# Patient Record
Sex: Female | Born: 1980 | Race: Asian | Hispanic: No | Marital: Married | Smoking: Never smoker
Health system: Southern US, Community
[De-identification: ages and names within clinical notes are randomized; demographics above are authoritative.]

## PROBLEM LIST (undated history)

## (undated) DIAGNOSIS — D649 Anemia, unspecified: Secondary | ICD-10-CM

## (undated) HISTORY — PX: LASIK: SHX215

---

## 2005-05-30 ENCOUNTER — Other Ambulatory Visit: Admission: RE | Admit: 2005-05-30 | Discharge: 2005-05-30 | Payer: Self-pay | Admitting: Obstetrics and Gynecology

## 2008-03-07 ENCOUNTER — Emergency Department (HOSPITAL_BASED_OUTPATIENT_CLINIC_OR_DEPARTMENT_OTHER): Admission: EM | Admit: 2008-03-07 | Discharge: 2008-03-07 | Payer: Self-pay | Admitting: Emergency Medicine

## 2009-11-03 ENCOUNTER — Inpatient Hospital Stay (HOSPITAL_COMMUNITY): Admission: AD | Admit: 2009-11-03 | Discharge: 2009-11-05 | Payer: Self-pay | Admitting: Obstetrics & Gynecology

## 2009-11-04 ENCOUNTER — Encounter (INDEPENDENT_AMBULATORY_CARE_PROVIDER_SITE_OTHER): Payer: Self-pay | Admitting: Obstetrics & Gynecology

## 2010-10-05 LAB — CBC
HCT: 34.1 % — ABNORMAL LOW (ref 36.0–46.0)
Hemoglobin: 10.9 g/dL — ABNORMAL LOW (ref 12.0–15.0)
RBC: 4.6 MIL/uL (ref 3.87–5.11)
RBC: 5.32 MIL/uL — ABNORMAL HIGH (ref 3.87–5.11)
WBC: 10.3 10*3/uL (ref 4.0–10.5)
WBC: 12.4 10*3/uL — ABNORMAL HIGH (ref 4.0–10.5)

## 2010-10-05 LAB — RPR: RPR Ser Ql: NONREACTIVE

## 2014-07-14 LAB — CHG URINE PREGNANCY TEST: Preg Test, Ur: POSITIVE

## 2014-07-15 ENCOUNTER — Telehealth: Payer: Self-pay

## 2014-07-15 DIAGNOSIS — Z3201 Encounter for pregnancy test, result positive: Secondary | ICD-10-CM

## 2014-07-15 NOTE — Telephone Encounter (Signed)
Patient called front office staff to schedule new OB appointment. [facility]. U/S for viability and dating scheduled 07/23/13 at 1415. Patient informed of U/S appointment date, time and location. Asked for a later appointment-- gave her [phone] to call and schedule for time convenient for her. Informed patient that once results are reviewed for dating we will call her with New OB appointment. Advised that she call clinic if she does not hear by Friday 07/25/13. Patient verbalized understanding. No questions or concerns.

## 2014-07-18 NOTE — L&D Delivery Note (Signed)
Delivery Note At 8:26 PM a viable female was delivered via Vaginal, Spontaneous Delivery (Presentation: Left Occiput Anterior).  APGAR: 9, 9; weight  .   Placenta status: Intact, Spontaneous.  Cord: 3 vessels with the following complications: None.  Cord pH: not indicated  Anesthesia: Epidural  Episiotomy:  none Lacerations:  2nd degree Suture Repair: 2.0 3.0 vicryl rapide 4 deep figure of sutures used to repair 2nd degree laceration that extended to (but not through) external anal sphincter. 3-0 vicryl used in standard fashion. rectal exam done upon completion to confirm good approximation of muscle and no missed tears Est. Blood Loss (mL):  By MD estimate 350 cc  Mom to postpartum.  Baby to Couplet care / Skin to Skin.  Carolyn Beard A. 03/05/2015, 8:49 PM

## 2014-07-21 ENCOUNTER — Encounter: Payer: Self-pay | Admitting: *Deleted

## 2014-07-23 ENCOUNTER — Ambulatory Visit (HOSPITAL_COMMUNITY): Payer: Self-pay

## 2014-07-25 ENCOUNTER — Ambulatory Visit (HOSPITAL_COMMUNITY)
Admission: RE | Admit: 2014-07-25 | Discharge: 2014-07-25 | Disposition: A | Discharge status: Home / Self Care | Payer: BC Managed Care – PPO | Source: Ambulatory Visit | Attending: Family Medicine | Admitting: Family Medicine

## 2014-07-25 DIAGNOSIS — Z3A01 Less than 8 weeks gestation of pregnancy: Secondary | ICD-10-CM | POA: Diagnosis not present

## 2014-07-25 DIAGNOSIS — O26841 Uterine size-date discrepancy, first trimester: Secondary | ICD-10-CM | POA: Insufficient documentation

## 2014-07-25 DIAGNOSIS — Z3201 Encounter for pregnancy test, result positive: Secondary | ICD-10-CM

## 2014-07-28 NOTE — Telephone Encounter (Signed)
Patient returned call. Informed her of U/S results and need to start prenatal care. Informed her she will be contacted by our front office staff with NOB appointment-- advised she call if she does not hear tomorrow. Also advised she start taking a PNV.  Patient verbalized understanding and gratitude. No questions or concerns.

## 2014-07-28 NOTE — Telephone Encounter (Signed)
Patient had U/S showing live IUP 4779w5d on 07/25/14. Needs to initiate prenatal care-- patient wanted to establish care here. Message sent to admin pool to schedule patient for New OB in 2-3 weeks. Attempted to contact patient to inform her of results.  No answer. Left message stating we are calling with results, please call clinic.

## 2014-08-04 ENCOUNTER — Ambulatory Visit (INDEPENDENT_AMBULATORY_CARE_PROVIDER_SITE_OTHER): Payer: BC Managed Care – PPO | Admitting: Obstetrics and Gynecology

## 2014-08-04 ENCOUNTER — Encounter: Payer: Self-pay | Admitting: Obstetrics and Gynecology

## 2014-08-04 VITALS — BP 114/75 | HR 80 | Temp 97.9°F | Ht 65.0 in | Wt 133.1 lb

## 2014-08-04 DIAGNOSIS — Z113 Encounter for screening for infections with a predominantly sexual mode of transmission: Secondary | ICD-10-CM | POA: Diagnosis not present

## 2014-08-04 DIAGNOSIS — Z23 Encounter for immunization: Secondary | ICD-10-CM | POA: Diagnosis not present

## 2014-08-04 DIAGNOSIS — Z3481 Encounter for supervision of other normal pregnancy, first trimester: Secondary | ICD-10-CM

## 2014-08-04 DIAGNOSIS — Z118 Encounter for screening for other infectious and parasitic diseases: Secondary | ICD-10-CM | POA: Diagnosis not present

## 2014-08-04 DIAGNOSIS — Z3480 Encounter for supervision of other normal pregnancy, unspecified trimester: Secondary | ICD-10-CM | POA: Insufficient documentation

## 2014-08-04 LAB — POCT URINALYSIS DIP (DEVICE)
Glucose, UA: NEGATIVE mg/dL
KETONES UR: NEGATIVE mg/dL
NITRITE: NEGATIVE
PH: 5.5 (ref 5.0–8.0)
PROTEIN: NEGATIVE mg/dL
Specific Gravity, Urine: >=1.03 (ref 1.005–1.030)
Urobilinogen, UA: 0.2 mg/dL (ref 0.0–1.0)

## 2014-08-04 NOTE — Progress Notes (Signed)
   Subjective:    Carolyn Beard is a G2P1002 8353w1d being seen today for her first obstetrical visit.  Her obstetrical history is significant for no complications. Patient does intend to breast feed. Pregnancy history fully reviewed.  Patient reports nausea at bedtime  Filed Vitals:   08/04/14 0854 08/04/14 0900  BP: 114/75   Pulse: 80   Temp: 97.9 F (36.6 C)   Height:  5\' 5"  (1.651 m)  Weight: 133 lb 1.6 oz (60.374 kg)     HISTORY: OB History  Gravida Para Term Preterm AB SAB TAB Ectopic Multiple Living  2 1 1  0 0 0 0 0 0 2    # Outcome Date GA Lbr Len/2nd Weight Sex Delivery Anes PTL Lv  2 Current           1 Term 11/04/09 7337w0d  5 lb 11 oz (2.58 kg) F Vag-Spont   Y     History reviewed. No pertinent past medical history. History reviewed. No pertinent past surgical history. History reviewed. No pertinent family history.   Exam    Uterus:     Pelvic Exam:    Perineum: Normal Perineum   Vulva: normal   Vagina:  normal mucosa, normal discharge   pH:    Cervix: closed and long   Adnexa: normal adnexa   Bony Pelvis: gynecoid  System: Breast:  normal appearance, no masses or tenderness   Skin: normal coloration and turgor, no rashes    Neurologic: oriented, no focal deficits   Extremities: normal strength, tone, and muscle mass   HEENT extra ocular movement intact   Mouth/Teeth mucous membranes moist, pharynx normal without lesions and dental hygiene good   Neck supple and no masses   Cardiovascular: regular rate and rhythm   Respiratory:  chest clear, no wheezing, crepitations, rhonchi, normal symmetric air entry   Abdomen: soft, non-tender; bowel sounds normal; no masses,  no organomegaly   Urinary:       Assessment:    Pregnancy: [id number] Patient Active Problem List   Diagnosis Date Noted  . Supervision of other normal pregnancy, antepartum 08/04/2014        Plan:     Initial labs drawn. Will obtain records for pap smear Prenatal  vitamins. Problem list reviewed and updated. Genetic Screening discussed First Screen: declined.  Ultrasound discussed; fetal survey: requested.  Follow up in 4 weeks. 50% of 30 min visit spent on counseling and coordination of care.     Mikell Camp 08/04/2014

## 2014-08-04 NOTE — Progress Notes (Signed)
Patient reports was told pre-diabetic a couple years ago-- has managed with exercise though reports PCP has not mentioned it since.  Reports she was told she was vitamin D deficient-- was taking Vitamin D regularly and has stopped since starting PNV.  Initial lab work today. Reports [facility]-- reports normal.  Flu shot today.  New OB packet given.

## 2014-08-04 NOTE — Patient Instructions (Signed)
First Trimester of Pregnancy The first trimester of pregnancy is from week 1 until the end of week 12 (months 1 through 3). A week after a sperm fertilizes an egg, the egg will implant on the wall of the uterus. This embryo will begin to develop into a baby. Genes from you and your partner are forming the baby. The female genes determine whether the baby is a boy or a girl. At 6-8 weeks, the eyes and face are formed, and the heartbeat can be seen on ultrasound. At the end of 12 weeks, all the baby's organs are formed.  Now that you are pregnant, you will want to do everything you can to have a healthy baby. Two of the most important things are to get good prenatal care and to follow your health care provider's instructions. Prenatal care is all the medical care you receive before the baby's birth. This care will help prevent, find, and treat any problems during the pregnancy and childbirth. BODY CHANGES Your body goes through many changes during pregnancy. The changes vary from woman to woman.   You may gain or lose a couple of pounds at first.  You may feel sick to your stomach (nauseous) and throw up (vomit). If the vomiting is uncontrollable, call your health care provider.  You may tire easily.  You may develop headaches that can be relieved by medicines approved by your health care provider.  You may urinate more often. Painful urination may mean you have a bladder infection.  You may develop heartburn as a result of your pregnancy.  You may develop constipation because certain hormones are causing the muscles that push waste through your intestines to slow down.  You may develop hemorrhoids or swollen, bulging veins (varicose veins).  Your breasts may begin to grow larger and become tender. Your nipples may stick out more, and the tissue that surrounds them (areola) may become darker.  Your gums may bleed and may be sensitive to brushing and flossing.  Dark spots or blotches  (chloasma, mask of pregnancy) may develop on your face. This will likely fade after the baby is born.  Your menstrual periods will stop.  You may have a loss of appetite.  You may develop cravings for certain kinds of food.  You may have changes in your emotions from day to day, such as being excited to be pregnant or being concerned that something may go wrong with the pregnancy and baby.  You may have more vivid and strange dreams.  You may have changes in your hair. These can include thickening of your hair, rapid growth, and changes in texture. Some women also have hair loss during or after pregnancy, or hair that feels dry or thin. Your hair will most likely return to normal after your baby is born. WHAT TO EXPECT AT YOUR PRENATAL VISITS During a routine prenatal visit:  You will be weighed to make sure you and the baby are growing normally.  Your blood pressure will be taken.  Your abdomen will be measured to track your baby's growth.  The fetal heartbeat will be listened to starting around week 10 or 12 of your pregnancy.  Test results from any previous visits will be discussed. Your health care provider may ask you:  How you are feeling.  If you are feeling the baby move.  If you have had any abnormal symptoms, such as leaking fluid, bleeding, severe headaches, or abdominal cramping.  If you have any questions. Other tests   that may be performed during your first trimester include:  Blood tests to find your blood type and to check for the presence of any previous infections. They will also be used to check for low iron levels (anemia) and Rh antibodies. Later in the pregnancy, blood tests for diabetes will be done along with other tests if problems develop.  Urine tests to check for infections, diabetes, or protein in the urine.  An ultrasound to confirm the proper growth and development of the baby.  An amniocentesis to check for possible genetic problems.  Fetal  screens for spina bifida and Down syndrome.  You may need other tests to make sure you and the baby are doing well. HOME CARE INSTRUCTIONS  Medicines  Follow your health care provider's instructions regarding medicine use. Specific medicines may be either safe or unsafe to take during pregnancy.  Take your prenatal vitamins as directed.  If you develop constipation, try taking a stool softener if your health care provider approves. Diet  Eat regular, well-balanced meals. Choose a variety of foods, such as meat or vegetable-based protein, fish, milk and low-fat dairy products, vegetables, fruits, and whole grain breads and cereals. Your health care provider will help you determine the amount of weight gain that is right for you.  Avoid raw meat and uncooked cheese. These carry germs that can cause birth defects in the baby.  Eating four or five small meals rather than three large meals a day may help relieve nausea and vomiting. If you start to feel nauseous, eating a few soda crackers can be helpful. Drinking liquids between meals instead of during meals also seems to help nausea and vomiting.  If you develop constipation, eat more high-fiber foods, such as fresh vegetables or fruit and whole grains. Drink enough fluids to keep your urine clear or pale yellow. Activity and Exercise  Exercise only as directed by your health care provider. Exercising will help you:  Control your weight.  Stay in shape.  Be prepared for labor and delivery.  Experiencing pain or cramping in the lower abdomen or low back is a good sign that you should stop exercising. Check with your health care provider before continuing normal exercises.  Try to avoid standing for long periods of time. Move your legs often if you must stand in one place for a long time.  Avoid heavy lifting.  Wear low-heeled shoes, and practice good posture.  You may continue to have sex unless your health care provider directs you  otherwise. Relief of Pain or Discomfort  Wear a good support bra for breast tenderness.   Take warm sitz baths to soothe any pain or discomfort caused by hemorrhoids. Use hemorrhoid cream if your health care provider approves.   Rest with your legs elevated if you have leg cramps or low back pain.  If you develop varicose veins in your legs, wear support hose. Elevate your feet for 15 minutes, 3-4 times a day. Limit salt in your diet. Prenatal Care  Schedule your prenatal visits by the twelfth week of pregnancy. They are usually scheduled monthly at first, then more often in the last 2 months before delivery.  Write down your questions. Take them to your prenatal visits.  Keep all your prenatal visits as directed by your health care provider. Safety  Wear your seat belt at all times when driving.  Make a list of emergency phone numbers, including numbers for family, friends, the hospital, and police and fire departments. General Tips    Ask your health care provider for a referral to a local prenatal education class. Begin classes no later than at the beginning of month 6 of your pregnancy.  Ask for help if you have counseling or nutritional needs during pregnancy. Your health care provider can offer advice or refer you to specialists for help with various needs.  Do not use hot tubs, steam rooms, or saunas.  Do not douche or use tampons or scented sanitary pads.  Do not cross your legs for long periods of time.  Avoid cat litter boxes and soil used by cats. These carry germs that can cause birth defects in the baby and possibly loss of the fetus by miscarriage or stillbirth.  Avoid all smoking, herbs, alcohol, and medicines not prescribed by your health care provider. Chemicals in these affect the formation and growth of the baby.  Schedule a dentist appointment. At home, brush your teeth with a soft toothbrush and be gentle when you floss. SEEK MEDICAL CARE IF:   You have  dizziness.  You have mild pelvic cramps, pelvic pressure, or nagging pain in the abdominal area.  You have persistent nausea, vomiting, or diarrhea.  You have a bad smelling vaginal discharge.  You have pain with urination.  You notice increased swelling in your face, hands, legs, or ankles. SEEK IMMEDIATE MEDICAL CARE IF:   You have a fever.  You are leaking fluid from your vagina.  You have spotting or bleeding from your vagina.  You have severe abdominal cramping or pain.  You have rapid weight gain or loss.  You vomit blood or material that looks like coffee grounds.  You are exposed to German measles and have never had them.  You are exposed to fifth disease or chickenpox.  You develop a severe headache.  You have shortness of breath.  You have any kind of trauma, such as from a fall or a car accident. Document Released: 06/28/2001 Document Revised: 11/18/2013 Document Reviewed: 05/14/2013 [employer] Patient Information 2015 [employer]. This information is not intended to replace advice given to you by your health care provider. Make sure you discuss any questions you have with your health care provider.  Contraception Choices Contraception (birth control) is the use of any methods or devices to prevent pregnancy. Below are some methods to help avoid pregnancy. HORMONAL METHODS   Contraceptive implant. This is a thin, plastic tube containing progesterone hormone. It does not contain estrogen hormone. Your health care provider inserts the tube in the inner part of the upper arm. The tube can remain in place for up to 3 years. After 3 years, the implant must be removed. The implant prevents the ovaries from releasing an egg (ovulation), thickens the cervical mucus to prevent sperm from entering the uterus, and thins the lining of the inside of the uterus.  Progesterone-only injections. These injections are given every 3 months by your health care provider to prevent  pregnancy. This synthetic progesterone hormone stops the ovaries from releasing eggs. It also thickens cervical mucus and changes the uterine lining. This makes it harder for sperm to survive in the uterus.  Birth control pills. These pills contain estrogen and progesterone hormone. They work by preventing the ovaries from releasing eggs (ovulation). They also cause the cervical mucus to thicken, preventing the sperm from entering the uterus. Birth control pills are prescribed by a health care provider.Birth control pills can also be used to treat heavy periods.  Minipill. This type of birth control pill contains   only the progesterone hormone. They are taken every day of each month and must be prescribed by your health care provider.  Birth control patch. The patch contains hormones similar to those in birth control pills. It must be changed once a week and is prescribed by a health care provider.  Vaginal ring. The ring contains hormones similar to those in birth control pills. It is left in the vagina for 3 weeks, removed for 1 week, and then a new one is put back in place. The patient must be comfortable inserting and removing the ring from the vagina.A health care provider's prescription is necessary.  Emergency contraception. Emergency contraceptives prevent pregnancy after unprotected sexual intercourse. This pill can be taken right after sex or up to 5 days after unprotected sex. It is most effective the sooner you take the pills after having sexual intercourse. Most emergency contraceptive pills are available without a prescription. Check with your pharmacist. Do not use emergency contraception as your only form of birth control. BARRIER METHODS   Female condom. This is a thin sheath (latex or rubber) that is worn over the penis during sexual intercourse. It can be used with spermicide to increase effectiveness.  Female condom. This is a soft, loose-fitting sheath that is put into the vagina  before sexual intercourse.  Diaphragm. This is a soft, latex, dome-shaped barrier that must be fitted by a health care provider. It is inserted into the vagina, along with a spermicidal jelly. It is inserted before intercourse. The diaphragm should be left in the vagina for 6 to 8 hours after intercourse.  Cervical cap. This is a round, soft, latex or plastic cup that fits over the cervix and must be fitted by a health care provider. The cap can be left in place for up to 48 hours after intercourse.  Sponge. This is a soft, circular piece of polyurethane foam. The sponge has spermicide in it. It is inserted into the vagina after wetting it and before sexual intercourse.  Spermicides. These are chemicals that kill or block sperm from entering the cervix and uterus. They come in the form of creams, jellies, suppositories, foam, or tablets. They do not require a prescription. They are inserted into the vagina with an applicator before having sexual intercourse. The process must be repeated every time you have sexual intercourse. INTRAUTERINE CONTRACEPTION  Intrauterine device (IUD). This is a T-shaped device that is put in a woman's uterus during a menstrual period to prevent pregnancy. There are 2 types:  Copper IUD. This type of IUD is wrapped in copper wire and is placed inside the uterus. Copper makes the uterus and fallopian tubes produce a fluid that kills sperm. It can stay in place for 10 years.  Hormone IUD. This type of IUD contains the hormone progestin (synthetic progesterone). The hormone thickens the cervical mucus and prevents sperm from entering the uterus, and it also thins the uterine lining to prevent implantation of a fertilized egg. The hormone can weaken or kill the sperm that get into the uterus. It can stay in place for 3-5 years, depending on which type of IUD is used. PERMANENT METHODS OF CONTRACEPTION  Female tubal ligation. This is when the woman's fallopian tubes are  surgically sealed, tied, or blocked to prevent the egg from traveling to the uterus.  Hysteroscopic sterilization. This involves placing a small coil or insert into each fallopian tube. Your doctor uses a technique called hysteroscopy to do the procedure. The device causes scar tissue   to form. This results in permanent blockage of the fallopian tubes, so the sperm cannot fertilize the egg. It takes about 3 months after the procedure for the tubes to become blocked. You must use another form of birth control for these 3 months.  Female sterilization. This is when the female has the tubes that carry sperm tied off (vasectomy).This blocks sperm from entering the vagina during sexual intercourse. After the procedure, the man can still ejaculate fluid (semen). NATURAL PLANNING METHODS  Natural family planning. This is not having sexual intercourse or using a barrier method (condom, diaphragm, cervical cap) on days the woman could become pregnant.  Calendar method. This is keeping track of the length of each menstrual cycle and identifying when you are fertile.  Ovulation method. This is avoiding sexual intercourse during ovulation.  Symptothermal method. This is avoiding sexual intercourse during ovulation, using a thermometer and ovulation symptoms.  Post-ovulation method. This is timing sexual intercourse after you have ovulated. Regardless of which type or method of contraception you choose, it is important that you use condoms to protect against the transmission of sexually transmitted infections (STIs). Talk with your health care provider about which form of contraception is most appropriate for you. Document Released: 07/04/2005 Document Revised: 07/09/2013 Document Reviewed: 12/27/2012 [employer] Patient Information 2015 [employer]. This information is not intended to replace advice given to you by your health care provider. Make sure you discuss any questions you have with your health care  provider.  Breastfeeding Deciding to breastfeed is one of the best choices you can make for you and your baby. A change in hormones during pregnancy causes your breast tissue to grow and increases the number and size of your milk ducts. These hormones also allow proteins, sugars, and fats from your blood supply to make breast milk in your milk-producing glands. Hormones prevent breast milk from being released before your baby is born as well as prompt milk flow after birth. Once breastfeeding has begun, thoughts of your baby, as well as his or her sucking or crying, can stimulate the release of milk from your milk-producing glands.  BENEFITS OF BREASTFEEDING For Your Baby  Your first milk (colostrum) helps your baby's digestive system function better.   There are antibodies in your milk that help your baby fight off infections.   Your baby has a lower incidence of asthma, allergies, and sudden infant death syndrome.   The nutrients in breast milk are better for your baby than infant formulas and are designed uniquely for your baby's needs.   Breast milk improves your baby's brain development.   Your baby is less likely to develop other conditions, such as childhood obesity, asthma, or type 2 diabetes mellitus.  For You   Breastfeeding helps to create a very special bond between you and your baby.   Breastfeeding is convenient. Breast milk is always available at the correct temperature and costs nothing.   Breastfeeding helps to burn calories and helps you lose the weight gained during pregnancy.   Breastfeeding makes your uterus contract to its prepregnancy size faster and slows bleeding (lochia) after you give birth.   Breastfeeding helps to lower your risk of developing type 2 diabetes mellitus, osteoporosis, and breast or ovarian cancer later in life. SIGNS THAT YOUR BABY IS HUNGRY Early Signs of Hunger  Increased alertness or activity.  Stretching.  Movement of the  head from side to side.  Movement of the head and opening of the mouth when the corner   of the mouth or cheek is stroked (rooting).  Increased sucking sounds, smacking lips, cooing, sighing, or squeaking.  Hand-to-mouth movements.  Increased sucking of fingers or hands. Late Signs of Hunger  Fussing.  Intermittent crying. Extreme Signs of Hunger Signs of extreme hunger will require calming and consoling before your baby will be able to breastfeed successfully. Do not wait for the following signs of extreme hunger to occur before you initiate breastfeeding:   Restlessness.  A loud, strong cry.   Screaming. BREASTFEEDING BASICS Breastfeeding Initiation  Find a comfortable place to sit or lie down, with your neck and back well supported.  Place a pillow or rolled up blanket under your baby to bring him or her to the level of your breast (if you are seated). Nursing pillows are specially designed to help support your arms and your baby while you breastfeed.  Make sure that your baby's abdomen is facing your abdomen.   Gently massage your breast. With your fingertips, massage from your chest wall toward your nipple in a circular motion. This encourages milk flow. You may need to continue this action during the feeding if your milk flows slowly.  Support your breast with 4 fingers underneath and your thumb above your nipple. Make sure your fingers are well away from your nipple and your baby's mouth.   Stroke your baby's lips gently with your finger or nipple.   When your baby's mouth is open wide enough, quickly bring your baby to your breast, placing your entire nipple and as much of the colored area around your nipple (areola) as possible into your baby's mouth.   More areola should be visible above your baby's upper lip than below the lower lip.   Your baby's tongue should be between his or her lower gum and your breast.   Ensure that your baby's mouth is correctly  positioned around your nipple (latched). Your baby's lips should create a seal on your breast and be turned out (everted).  It is common for your baby to suck about 2-3 minutes in order to start the flow of breast milk. Latching Teaching your baby how to latch on to your breast properly is very important. An improper latch can cause nipple pain and decreased milk supply for you and poor weight gain in your baby. Also, if your baby is not latched onto your nipple properly, he or she may swallow some air during feeding. This can make your baby fussy. Burping your baby when you switch breasts during the feeding can help to get rid of the air. However, teaching your baby to latch on properly is still the best way to prevent fussiness from swallowing air while breastfeeding. Signs that your baby has successfully latched on to your nipple:    Silent tugging or silent sucking, without causing you pain.   Swallowing heard between every 3-4 sucks.    Muscle movement above and in front of his or her ears while sucking.  Signs that your baby has not successfully latched on to nipple:   Sucking sounds or smacking sounds from your baby while breastfeeding.  Nipple pain. If you think your baby has not latched on correctly, slip your finger into the corner of your baby's mouth to break the suction and place it between your baby's gums. Attempt breastfeeding initiation again. Signs of Successful Breastfeeding Signs from your baby:   A gradual decrease in the number of sucks or complete cessation of sucking.   Falling asleep.     Relaxation of his or her body.   Retention of a small amount of milk in his or her mouth.   Letting go of your breast by himself or herself. Signs from you:  Breasts that have increased in firmness, weight, and size 1-3 hours after feeding.   Breasts that are softer immediately after breastfeeding.  Increased milk volume, as well as a change in milk consistency  and color by the fifth day of breastfeeding.   Nipples that are not sore, cracked, or bleeding. Signs That Your Baby is Getting Enough Milk  Wetting at least 3 diapers in a 24-hour period. The urine should be clear and pale yellow by age 5 days.  At least 3 stools in a 24-hour period by age 5 days. The stool should be soft and yellow.  At least 3 stools in a 24-hour period by age 7 days. The stool should be seedy and yellow.  No loss of weight greater than 10% of birth weight during the first 3 days of age.  Average weight gain of 4-7 ounces (113-198 g) per week after age 4 days.  Consistent daily weight gain by age 5 days, without weight loss after the age of 2 weeks. After a feeding, your baby may spit up a small amount. This is common. BREASTFEEDING FREQUENCY AND DURATION Frequent feeding will help you make more milk and can prevent sore nipples and breast engorgement. Breastfeed when you feel the need to reduce the fullness of your breasts or when your baby shows signs of hunger. This is called "breastfeeding on demand." Avoid introducing a pacifier to your baby while you are working to establish breastfeeding (the first 4-6 weeks after your baby is born). After this time you may choose to use a pacifier. Research has shown that pacifier use during the first year of a baby's life decreases the risk of sudden infant death syndrome (SIDS). Allow your baby to feed on each breast as long as he or she wants. Breastfeed until your baby is finished feeding. When your baby unlatches or falls asleep while feeding from the first breast, offer the second breast. Because newborns are often sleepy in the first few weeks of life, you may need to awaken your baby to get him or her to feed. Breastfeeding times will vary from baby to baby. However, the following rules can serve as a guide to help you ensure that your baby is properly fed:  Newborns (babies 4 weeks of age or younger) may breastfeed every  1-3 hours.  Newborns should not go longer than 3 hours during the day or 5 hours during the night without breastfeeding.  You should breastfeed your baby a minimum of 8 times in a 24-hour period until you begin to introduce solid foods to your baby at around 6 months of age. BREAST MILK PUMPING Pumping and storing breast milk allows you to ensure that your baby is exclusively fed your breast milk, even at times when you are unable to breastfeed. This is especially important if you are going back to work while you are still breastfeeding or when you are not able to be present during feedings. Your lactation consultant can give you guidelines on how long it is safe to store breast milk.  A breast pump is a machine that allows you to pump milk from your breast into a sterile bottle. The pumped breast milk can then be stored in a refrigerator or freezer. Some breast pumps are operated by hand, while others   use electricity. Ask your lactation consultant which type will work best for you. Breast pumps can be purchased, but some hospitals and breastfeeding support groups lease breast pumps on a monthly basis. A lactation consultant can teach you how to hand express breast milk, if you prefer not to use a pump.  CARING FOR YOUR BREASTS WHILE YOU BREASTFEED Nipples can become dry, cracked, and sore while breastfeeding. The following recommendations can help keep your breasts moisturized and healthy:  Avoid using soap on your nipples.   Wear a supportive bra. Although not required, special nursing bras and tank tops are designed to allow access to your breasts for breastfeeding without taking off your entire bra or top. Avoid wearing underwire-style bras or extremely tight bras.  Air dry your nipples for 3-4minutes after each feeding.   Use only cotton bra pads to absorb leaked breast milk. Leaking of breast milk between feedings is normal.   Use lanolin on your nipples after breastfeeding. Lanolin  helps to maintain your skin's normal moisture barrier. If you use pure lanolin, you do not need to wash it off before feeding your baby again. Pure lanolin is not toxic to your baby. You may also hand express a few drops of breast milk and gently massage that milk into your nipples and allow the milk to air dry. In the first few weeks after giving birth, some women experience extremely full breasts (engorgement). Engorgement can make your breasts feel heavy, warm, and tender to the touch. Engorgement peaks within 3-5 days after you give birth. The following recommendations can help ease engorgement:  Completely empty your breasts while breastfeeding or pumping. You may want to start by applying warm, moist heat (in the shower or with warm water-soaked hand towels) just before feeding or pumping. This increases circulation and helps the milk flow. If your baby does not completely empty your breasts while breastfeeding, pump any extra milk after he or she is finished.  Wear a snug bra (nursing or regular) or tank top for 1-2 days to signal your body to slightly decrease milk production.  Apply ice packs to your breasts, unless this is too uncomfortable for you.  Make sure that your baby is latched on and positioned properly while breastfeeding. If engorgement persists after 48 hours of following these recommendations, contact your health care provider or a lactation consultant. OVERALL HEALTH CARE RECOMMENDATIONS WHILE BREASTFEEDING  Eat healthy foods. Alternate between meals and snacks, eating 3 of each per day. Because what you eat affects your breast milk, some of the foods may make your baby more irritable than usual. Avoid eating these foods if you are sure that they are negatively affecting your baby.  Drink milk, fruit juice, and water to satisfy your thirst (about 10 glasses a day).   Rest often, relax, and continue to take your prenatal vitamins to prevent fatigue, stress, and  anemia.  Continue breast self-awareness checks.  Avoid chewing and smoking tobacco.  Avoid alcohol and drug use. Some medicines that may be harmful to your baby can pass through breast milk. It is important to ask your health care provider before taking any medicine, including all over-the-counter and prescription medicine as well as vitamin and herbal supplements. It is possible to become pregnant while breastfeeding. If birth control is desired, ask your health care provider about options that will be safe for your baby. SEEK MEDICAL CARE IF:   You feel like you want to stop breastfeeding or have become frustrated with breastfeeding.    You have painful breasts or nipples.  Your nipples are cracked or bleeding.  Your breasts are red, tender, or warm.  You have a swollen area on either breast.  You have a fever or chills.  You have nausea or vomiting.  You have drainage other than breast milk from your nipples.  Your breasts do not become full before feedings by the fifth day after you give birth.  You feel sad and depressed.  Your baby is too sleepy to eat well.  Your baby is having trouble sleeping.   Your baby is wetting less than 3 diapers in a 24-hour period.  Your baby has less than 3 stools in a 24-hour period.  Your baby's skin or the white part of his or her eyes becomes yellow.   Your baby is not gaining weight by 5 days of age. SEEK IMMEDIATE MEDICAL CARE IF:   Your baby is overly tired (lethargic) and does not want to wake up and feed.  Your baby develops an unexplained fever. Document Released: 07/04/2005 Document Revised: 07/09/2013 Document Reviewed: 12/26/2012 Exit[employer] Patient Information 2015 [employer]. This information is not intended to replace advice given to you by your health care provider. Make sure you discuss any questions you have with your health care provider.  

## 2014-08-04 NOTE — Progress Notes (Signed)
ROI signed for pap results

## 2014-08-05 LAB — GC/CHLAMYDIA PROBE AMP
CT PROBE, AMP APTIMA: NEGATIVE
GC Probe RNA: NEGATIVE

## 2014-08-05 LAB — PRENATAL PROFILE (SOLSTAS)
ANTIBODY SCREEN: NEGATIVE
BASOS PCT: 0 % (ref 0–1)
Basophils Absolute: 0 10*3/uL (ref 0.0–0.1)
EOS ABS: 0 10*3/uL (ref 0.0–0.7)
Eosinophils Relative: 0 % (ref 0–5)
HEMATOCRIT: 38.6 % (ref 36.0–46.0)
HEMOGLOBIN: 12.4 g/dL (ref 12.0–15.0)
HIV: NONREACTIVE
Hepatitis B Surface Ag: NEGATIVE
LYMPHS ABS: 2.1 10*3/uL (ref 0.7–4.0)
LYMPHS PCT: 19 % (ref 12–46)
MCH: 21.8 pg — ABNORMAL LOW (ref 26.0–34.0)
MCHC: 32.1 g/dL (ref 30.0–36.0)
MCV: 67.8 fL — AB (ref 78.0–100.0)
MONO ABS: 0.9 10*3/uL (ref 0.1–1.0)
Monocytes Relative: 8 % (ref 3–12)
NEUTROS ABS: 8.2 10*3/uL — AB (ref 1.7–7.7)
NEUTROS PCT: 73 % (ref 43–77)
PLATELETS: 291 10*3/uL (ref 150–400)
RBC: 5.69 MIL/uL — ABNORMAL HIGH (ref 3.87–5.11)
RDW: 16.6 % — ABNORMAL HIGH (ref 11.5–15.5)
RUBELLA: 0.67 {index} (ref <0.90)
Rh Type: POSITIVE
WBC: 11.3 10*3/uL — AB (ref 4.0–10.5)

## 2014-08-05 LAB — CULTURE, OB URINE
Colony Count: NO GROWTH
Organism ID, Bacteria: NO GROWTH

## 2014-08-12 ENCOUNTER — Encounter: Payer: BC Managed Care – PPO | Admitting: Physician Assistant

## 2014-08-13 LAB — PRESCRIPTION MONITORING PROFILE (19 PANEL)
AMPHETAMINE/METH: NEGATIVE ng/mL
BENZODIAZEPINE SCREEN, URINE: NEGATIVE ng/mL
BUPRENORPHINE, URINE: NEGATIVE ng/mL
Barbiturate Screen, Urine: NEGATIVE ng/mL
CANNABINOID SCRN UR: NEGATIVE ng/mL
CREATININE, URINE: 257.79 mg/dL (ref >20.0)
Carisoprodol, Urine: NEGATIVE ng/mL
Cocaine Metabolites: NEGATIVE ng/mL
FENTANYL URINE: NEGATIVE ng/mL
MDMA URINE: NEGATIVE ng/mL
METHADONE SCREEN, URINE: NEGATIVE ng/mL
METHAQUALONE SCREEN (URINE): NEGATIVE ng/mL
Meperidine, Ur: NEGATIVE ng/mL
Nitrites, Initial: NEGATIVE ug/mL
OPIATE SCREEN, URINE: NEGATIVE ng/mL
Oxycodone Screen, Ur: NEGATIVE ng/mL
PH URINE, INITIAL: 5.8 pH (ref 4.5–8.9)
PHENCYCLIDINE, UR: NEGATIVE ng/mL
PROPOXYPHENE: NEGATIVE ng/mL
TAPENTADOLUR: NEGATIVE ng/mL
TRAMADOL UR: NEGATIVE ng/mL
ZOLPIDEM, URINE: NEGATIVE ng/mL

## 2014-08-27 LAB — OB RESULTS CONSOLE GC/CHLAMYDIA
CHLAMYDIA, DNA PROBE: NEGATIVE
GC PROBE AMP, GENITAL: NEGATIVE

## 2014-09-04 ENCOUNTER — Encounter: Payer: BC Managed Care – PPO | Admitting: Physician Assistant

## 2015-02-13 LAB — OB RESULTS CONSOLE GBS: GBS: NEGATIVE

## 2015-03-05 ENCOUNTER — Inpatient Hospital Stay (HOSPITAL_COMMUNITY): Payer: BC Managed Care – PPO | Admitting: Anesthesiology

## 2015-03-05 ENCOUNTER — Encounter (HOSPITAL_COMMUNITY): Payer: Self-pay | Admitting: *Deleted

## 2015-03-05 ENCOUNTER — Inpatient Hospital Stay (HOSPITAL_COMMUNITY)
Admission: AD | Admit: 2015-03-05 | Discharge: 2015-03-07 | DRG: 775 | Disposition: A | Discharge status: Home / Self Care | Payer: BC Managed Care – PPO | Source: Ambulatory Visit | Attending: Obstetrics | Admitting: Obstetrics

## 2015-03-05 DIAGNOSIS — Z3A38 38 weeks gestation of pregnancy: Secondary | ICD-10-CM | POA: Diagnosis present

## 2015-03-05 DIAGNOSIS — O9962 Diseases of the digestive system complicating childbirth: Secondary | ICD-10-CM | POA: Diagnosis present

## 2015-03-05 DIAGNOSIS — D649 Anemia, unspecified: Secondary | ICD-10-CM | POA: Diagnosis not present

## 2015-03-05 DIAGNOSIS — K219 Gastro-esophageal reflux disease without esophagitis: Secondary | ICD-10-CM | POA: Diagnosis present

## 2015-03-05 DIAGNOSIS — O9902 Anemia complicating childbirth: Secondary | ICD-10-CM | POA: Diagnosis present

## 2015-03-05 DIAGNOSIS — Z3483 Encounter for supervision of other normal pregnancy, third trimester: Secondary | ICD-10-CM

## 2015-03-05 DIAGNOSIS — Z3490 Encounter for supervision of normal pregnancy, unspecified, unspecified trimester: Secondary | ICD-10-CM

## 2015-03-05 HISTORY — DX: Anemia, unspecified: D64.9

## 2015-03-05 LAB — TYPE AND SCREEN
ABO/RH(D): AB POS
Antibody Screen: NEGATIVE

## 2015-03-05 LAB — CBC
HCT: 37.8 % (ref 36.0–46.0)
HEMOGLOBIN: 12.6 g/dL (ref 12.0–15.0)
MCH: 23 pg — AB (ref 26.0–34.0)
MCHC: 33.3 g/dL (ref 30.0–36.0)
MCV: 69 fL — AB (ref 78.0–100.0)
PLATELETS: 230 10*3/uL (ref 150–400)
RBC: 5.48 MIL/uL — ABNORMAL HIGH (ref 3.87–5.11)
RDW: 16.2 % — ABNORMAL HIGH (ref 11.5–15.5)
WBC: 11.5 10*3/uL — ABNORMAL HIGH (ref 4.0–10.5)

## 2015-03-05 LAB — ABO/RH: ABO/RH(D): AB POS

## 2015-03-05 MED ORDER — LIDOCAINE HCL (PF) 1 % IJ SOLN
INTRAMUSCULAR | Status: DC | PRN
  Administered 2015-03-05 (×2): 5 mL

## 2015-03-05 MED ORDER — OXYTOCIN 40 UNITS IN LACTATED RINGERS INFUSION - SIMPLE MED
62.5000 mL/h | INTRAVENOUS | Status: DC
  Administered 2015-03-05: 62.5 mL/h via INTRAVENOUS
  Filled 2015-03-05: qty 1000

## 2015-03-05 MED ORDER — FENTANYL 2.5 MCG/ML BUPIVACAINE 1/10 % EPIDURAL INFUSION (WH - ANES)
INTRAMUSCULAR | Status: DC | PRN
  Administered 2015-03-05: 14 mL/h via EPIDURAL

## 2015-03-05 MED ORDER — ACETAMINOPHEN 325 MG PO TABS
650.0000 mg | ORAL_TABLET | ORAL | Status: DC | PRN
  Administered 2015-03-07: 650 mg via ORAL
  Filled 2015-03-05: qty 2

## 2015-03-05 MED ORDER — OXYCODONE-ACETAMINOPHEN 5-325 MG PO TABS: 1.0000 | ORAL_TABLET | ORAL | Status: DC | PRN

## 2015-03-05 MED ORDER — LACTATED RINGERS IV SOLN
INTRAVENOUS | Status: DC
  Administered 2015-03-05: 17:00:00 via INTRAVENOUS

## 2015-03-05 MED ORDER — OXYTOCIN BOLUS FROM INFUSION: 500.0000 mL | INTRAVENOUS | Status: DC

## 2015-03-05 MED ORDER — ONDANSETRON HCL 4 MG/2ML IJ SOLN: 4.0000 mg | INTRAMUSCULAR | Status: DC | PRN

## 2015-03-05 MED ORDER — ZOLPIDEM TARTRATE 5 MG PO TABS: 5.0000 mg | ORAL_TABLET | Freq: Every evening | ORAL | Status: DC | PRN

## 2015-03-05 MED ORDER — DIPHENHYDRAMINE HCL 50 MG/ML IJ SOLN: 12.5000 mg | INTRAMUSCULAR | Status: DC | PRN

## 2015-03-05 MED ORDER — DIBUCAINE 1 % RE OINT
1.0000 "application " | TOPICAL_OINTMENT | RECTAL | Status: DC | PRN
  Administered 2015-03-06 (×2): 1 via RECTAL
  Filled 2015-03-05 (×2): qty 28

## 2015-03-05 MED ORDER — PRENATAL MULTIVITAMIN CH
1.0000 | ORAL_TABLET | Freq: Every day | ORAL | Status: DC
  Administered 2015-03-06 – 2015-03-07 (×2): 1 via ORAL
  Filled 2015-03-05 (×2): qty 1

## 2015-03-05 MED ORDER — WITCH HAZEL-GLYCERIN EX PADS
1.0000 "application " | MEDICATED_PAD | CUTANEOUS | Status: DC | PRN
  Administered 2015-03-06: 1 via TOPICAL

## 2015-03-05 MED ORDER — DIPHENHYDRAMINE HCL 25 MG PO CAPS: 25.0000 mg | ORAL_CAPSULE | Freq: Four times a day (QID) | ORAL | Status: DC | PRN

## 2015-03-05 MED ORDER — IBUPROFEN 600 MG PO TABS
600.0000 mg | ORAL_TABLET | Freq: Four times a day (QID) | ORAL | Status: DC
  Administered 2015-03-06 – 2015-03-07 (×7): 600 mg via ORAL
  Filled 2015-03-05 (×7): qty 1

## 2015-03-05 MED ORDER — LIDOCAINE HCL (PF) 1 % IJ SOLN
30.0000 mL | INTRAMUSCULAR | Status: DC | PRN
  Filled 2015-03-05 (×2): qty 30

## 2015-03-05 MED ORDER — LACTATED RINGERS IV SOLN: 500.0000 mL | INTRAVENOUS | Status: DC | PRN

## 2015-03-05 MED ORDER — BUTORPHANOL TARTRATE 1 MG/ML IJ SOLN: 1.0000 mg | INTRAMUSCULAR | Status: DC | PRN

## 2015-03-05 MED ORDER — ACETAMINOPHEN 325 MG PO TABS: 650.0000 mg | ORAL_TABLET | ORAL | Status: DC | PRN

## 2015-03-05 MED ORDER — LANOLIN HYDROUS EX OINT: TOPICAL_OINTMENT | CUTANEOUS | Status: DC | PRN

## 2015-03-05 MED ORDER — OXYCODONE-ACETAMINOPHEN 5-325 MG PO TABS: 2.0000 | ORAL_TABLET | ORAL | Status: DC | PRN

## 2015-03-05 MED ORDER — FENTANYL 2.5 MCG/ML BUPIVACAINE 1/10 % EPIDURAL INFUSION (WH - ANES)
14.0000 mL/h | INTRAMUSCULAR | Status: DC | PRN
  Filled 2015-03-05: qty 125

## 2015-03-05 MED ORDER — PHENYLEPHRINE 40 MCG/ML (10ML) SYRINGE FOR IV PUSH (FOR BLOOD PRESSURE SUPPORT)
80.0000 ug | PREFILLED_SYRINGE | INTRAVENOUS | Status: DC | PRN
  Filled 2015-03-05: qty 2
  Filled 2015-03-05: qty 20

## 2015-03-05 MED ORDER — SENNOSIDES-DOCUSATE SODIUM 8.6-50 MG PO TABS
2.0000 | ORAL_TABLET | ORAL | Status: DC
  Administered 2015-03-06 (×2): 2 via ORAL
  Filled 2015-03-05 (×2): qty 2

## 2015-03-05 MED ORDER — ONDANSETRON HCL 4 MG/2ML IJ SOLN: 4.0000 mg | Freq: Four times a day (QID) | INTRAMUSCULAR | Status: DC | PRN

## 2015-03-05 MED ORDER — EPHEDRINE 5 MG/ML INJ
10.0000 mg | INTRAVENOUS | Status: DC | PRN
  Filled 2015-03-05: qty 2

## 2015-03-05 MED ORDER — CITRIC ACID-SODIUM CITRATE 334-500 MG/5ML PO SOLN: 30.0000 mL | ORAL | Status: DC | PRN

## 2015-03-05 MED ORDER — BENZOCAINE-MENTHOL 20-0.5 % EX AERO
1.0000 "application " | INHALATION_SPRAY | CUTANEOUS | Status: DC | PRN
  Administered 2015-03-06: 1 via TOPICAL
  Filled 2015-03-05: qty 56

## 2015-03-05 MED ORDER — FLEET ENEMA 7-19 GM/118ML RE ENEM: 1.0000 | ENEMA | RECTAL | Status: DC | PRN

## 2015-03-05 MED ORDER — SIMETHICONE 80 MG PO CHEW: 80.0000 mg | CHEWABLE_TABLET | ORAL | Status: DC | PRN

## 2015-03-05 MED ORDER — TETANUS-DIPHTH-ACELL PERTUSSIS 5-2.5-18.5 LF-MCG/0.5 IM SUSP: 0.5000 mL | Freq: Once | INTRAMUSCULAR | Status: DC

## 2015-03-05 MED ORDER — ONDANSETRON HCL 4 MG PO TABS: 4.0000 mg | ORAL_TABLET | ORAL | Status: DC | PRN

## 2015-03-05 MED ORDER — FENTANYL 2.5 MCG/ML BUPIVACAINE 1/10 % EPIDURAL INFUSION (WH - ANES): 14.0000 mL/h | INTRAMUSCULAR | Status: DC | PRN

## 2015-03-05 NOTE — Anesthesia Procedure Notes (Signed)
Epidural Patient location during procedure: OB Start time: 03/05/2015 5:56 PM End time: 03/05/2015 6:11 PM  Staffing Anesthesiologist: Sebastian Ache  Preanesthetic Checklist Completed: patient identified, site marked, surgical consent, pre-op evaluation, timeout performed, IV checked, risks and benefits discussed and monitors and equipment checked  Epidural Prep: site prepped and draped and DuraPrep Patient monitoring: heart rate, continuous pulse ox and blood pressure Approach: midline Location: L3-L4 Injection technique: LOR air  Needle:  Needle type: Tuohy  Needle gauge: 17 G Needle length: 9 cm Needle insertion depth: 5 cm Catheter size: 19 Gauge Catheter at skin depth: 13.5 cm Test dose: negative  Additional Notes No complicationsReason for block:procedure for pain

## 2015-03-05 NOTE — Progress Notes (Signed)
S: Doing well, no complaints, pain well controlled with epidural  O: BP 118/68 mmHg  Pulse 82  Temp(Src) 98 F (36.7 C) (Oral)  Resp 18  Ht  (1.651 m)  Wt 78.019 kg (172 lb)  BMI 28.62 kg/m2  SpO2 99%   FHT:  FHR: 120s bpm, variability: moderate,  accelerations:  Present,  decelerations:  Absent UC:   regular, every 3 minutes SVE:   Dilation: 6 Effacement (%): 100 Station: -1 Exam by:: Ace Gins, RN AROM clear  A / P:  33 y.o.  Obstetric History   G2   P1   T1   P0   A0   TAB0   SAB0   E0   M0   L1    at [redacted]w[redacted]d Active labor, expectant management  Fetal Wellbeing:  Category I Pain Control:  Epidural  Anticipated MOD:  NSVD  Carolyn Beard A. 03/05/2015, 7:11 PM

## 2015-03-05 NOTE — H&P (Signed)
Carolyn Beard is a 34 y.o. G2P1001 at [redacted]w[redacted]d presenting for active. Pt notes contractions. Good fetal movement, No vaginal bleeding, not leaking fluid.  PNCare at [facility] since 10 wks - Dated by LMP and early u/s at [facility] - Anemia of preg - GERD - hemorrhoids   Prenatal Transfer Tool  Maternal Diabetes: No Genetic Screening: Normal Maternal Ultrasounds/Referrals: Normal Fetal Ultrasounds or other Referrals:  None Maternal Substance Abuse:  No Significant Maternal Medications:  None Significant Maternal Lab Results: None     OB History    Gravida Para Term Preterm AB TAB SAB Ectopic Multiple Living   0 0 0 0 0 0 1     Past Medical History  Diagnosis Date  . Anemia    Past Surgical History  Procedure Laterality Date  . Lasik     Family History: family history is not on file. Social History:  reports that she has never smoked. She has never used smokeless tobacco. She reports that she does not drink alcohol or use illicit drugs.  Review of Systems - Negative except contractons   Dilation: 6 Effacement (%): 100 Station: -1 Exam by:: Carolyn Beard Blood pressure 129/73, pulse 66, temperature 98.3 F (36.8 C), resp. rate 18, height  (1.651 m), weight 78.019 kg (172 lb).  Filed Vitals:   03/05/15 1605 03/05/15 1702  BP: 129/73 111/66  Pulse: 66 77  Temp: 98.3 F (36.8 C) 98 F (36.7 C)  TempSrc:  Oral  Resp: 18 20  Height:  (1.651 m)   Weight: 78.019 kg (172 lb)      Prenatal labs: ABO, Rh: AB/POS/-- (01/18 1156) Antibody: NEG (01/18 1156) Rubella:  immune RPR: NON REAC (01/18 1156)  HBsAg: NEGATIVE (01/18 1156)  HIV: NONREACTIVE (01/18 1156)  GBS:   neg 1 hr Glucola 130  Genetic screening nl quad Anatomy US normal   Assessment/Plan: 34 y.o. G2P1001 at [redacted]w[redacted]d Active labor, admit, expectant management, epidural.    [name] A. 03/05/2015, 5:02 PM

## 2015-03-05 NOTE — Anesthesia Preprocedure Evaluation (Signed)
Anesthesia Evaluation  Patient identified by MRN, date of birth, ID band Patient awake and Patient confused    Reviewed: Allergy & Precautions, H&P , NPO status , Patient's Chart, lab work & pertinent test results  Airway Mallampati: II       Dental   Pulmonary  breath sounds clear to auscultation  Pulmonary exam normal       Cardiovascular Exercise Tolerance: Good Normal cardiovascular examRhythm:regular Rate:Normal     Neuro/Psych    GI/Hepatic   Endo/Other    Renal/GU      Musculoskeletal   Abdominal   Peds  Hematology   Anesthesia Other Findings   Reproductive/Obstetrics (+) Pregnancy                             Anesthesia Physical Anesthesia Plan  ASA: II  Anesthesia Plan: Epidural   Post-op Pain Management:    Induction:   Airway Management Planned:   Additional Equipment:   Intra-op Plan:   Post-operative Plan:   Informed Consent: I have reviewed the patients History and Physical, chart, labs and discussed the procedure including the risks, benefits and alternatives for the proposed anesthesia with the patient or authorized representative who has indicated his/her understanding and acceptance.     Plan Discussed with:   Anesthesia Plan Comments:         Anesthesia Quick Evaluation  

## 2015-03-05 NOTE — MAU Note (Signed)
Pt was in office earlier this afternoon was 2 cm. Headed back home and contractions got worse. Reports her mucus plug come out an d seeing some bloody show.

## 2015-03-06 LAB — CBC
HEMATOCRIT: 33.5 % — AB (ref 36.0–46.0)
HEMOGLOBIN: 11 g/dL — AB (ref 12.0–15.0)
MCH: 22.6 pg — AB (ref 26.0–34.0)
MCHC: 32.8 g/dL (ref 30.0–36.0)
MCV: 68.9 fL — AB (ref 78.0–100.0)
Platelets: 195 10*3/uL (ref 150–400)
RBC: 4.86 MIL/uL (ref 3.87–5.11)
RDW: 16.2 % — ABNORMAL HIGH (ref 11.5–15.5)
WBC: 14.1 10*3/uL — ABNORMAL HIGH (ref 4.0–10.5)

## 2015-03-06 LAB — RPR: RPR: NONREACTIVE

## 2015-03-06 MED ORDER — MEASLES, MUMPS & RUBELLA VAC ~~LOC~~ INJ
0.5000 mL | INJECTION | Freq: Once | SUBCUTANEOUS | Status: AC
Start: 2015-03-06 — End: 2015-03-07
  Administered 2015-03-07: 0.5 mL via SUBCUTANEOUS
  Filled 2015-03-06 (×2): qty 0.5

## 2015-03-06 NOTE — Lactation Note (Signed)
This note was copied from the chart of Boy Elice Peeters. Lactation Consultation Note  Patient Name: Boy Nawaal Alling [id number] Date: 03/06/2015 Reason for consult: Initial assessment Mom is experienced BF and reports this baby is nursing well. Mom does report some nipple tenderness on left nipple, advised to apply EBM. Encouraged to continue to BF with feeding ques. Lactation brochure left for review, advised of OP services and support group. Encouraged to call for assist as needed/questions/concerns.   Maternal Data Has patient been taught Hand Expression?: Yes Does the patient have breastfeeding experience prior to this delivery?: Yes  Feeding    LATCH Score/Interventions                      Lactation Tools Discussed/Used     Consult Status Consult Status: Follow-up Date: 03/07/15 Follow-up type: In-patient    Alfred Levins 03/06/2015, 3:17 PM

## 2015-03-06 NOTE — Progress Notes (Addendum)
PPD 1 SVD  S:  Reports feeling well - still tired as not slept yet             Tolerating po/ No nausea or vomiting             Bleeding is light             Pain controlled with motrin             Up ad lib / ambulatory / voiding QS  Newborn breast feeding  O:               VS: BP 100/48 mmHg  Pulse 64  Temp(Src) 97.9 F (36.6 C) (Oral)  Resp 16  Ht $R'5\' 5"'bs$  (1.651 m)  Wt 78.019 kg (172 lb)  BMI 28.62 kg/m2  SpO2 98%  Breastfeeding? Unknown   LABS:              Recent Labs  03/05/15 1640 03/06/15 0625  WBC 11.5* 14.1*  HGB 12.6 11.0*  PLT 230 195               Blood type: --/--/AB POS, AB POS (08/18 1640)  Rubella: 0.67 (01/18 1156)                     I&O: Intake/Output      08/18 0701 - 08/19 0700 08/19 0701 - 08/20 0700   Urine (mL/kg/hr) 400    Blood 348    Total Output 748     Net -748          Urine Occurrence 1 x                  Physical Exam:             Alert and oriented X3   Abdomen: soft, non-tender, non-distended              Fundus: firm, non-tender, U-1  Perineum: moderate edema / ice pack in place  Lochia: moderate  Extremities: no edema, no calf pain or tenderness    A: PPD # 1   Doing well - stable status             Rubella - non-immune  P: Routine post partum orders  Anticipate DC tomorrow             Offer MMR booster prior to [place], [place], MSN, [facility] 03/06/2015, 9:27 AM

## 2015-03-06 NOTE — Anesthesia Postprocedure Evaluation (Signed)
Anesthesia Post Note  Patient: Carolyn Beard  Procedure(s) Performed: * No procedures listed *  Anesthesia type: Epidural  Patient location: Mother/Baby  Post pain: Pain level controlled  Post assessment: Post-op Vital signs reviewed  Last Vitals:  Filed Vitals:   03/06/15 0355  BP: 109/71  Pulse: 53  Temp: 36.4 C  Resp: 18    Post vital signs: Reviewed  Level of consciousness:alert  Complications: No apparent anesthesia complications

## 2015-03-07 MED ORDER — IBUPROFEN 600 MG PO TABS: 600.0000 mg | ORAL_TABLET | Freq: Four times a day (QID) | ORAL | Status: AC | PRN

## 2015-03-07 NOTE — Progress Notes (Signed)
PPD2 SVD:   S:  Pt reports feeling well Tolerating po/ Voiding without problems/ No n/v/ Bleeding is moderate/ Pain controlled withprescription NSAID's including ibuprofen (Motrin)  Newborn info female   O:  A & O x 3 well: Blood pressure 110/61, pulse 62, temperature 97.9 F (36.6 C), temperature source Oral, resp. rate 18, height  (1.651 m), weight 78.019 kg (172 lb), SpO2 98 %, unknown if currently breastfeeding.  LABS: No results found for this or any previous visit (from the past 24 hour(s)).  I&O: I/O last 3 completed shifts: In: -  Out: 748 [Urine:400; Blood:348]      Lungs: chest clear, no wheezing, rales, normal symmetric air entry, Heart exam - S1, S2 normal, no murmur, no gallop, rate regular  Heart: regular rate and rhythm, S1, S2 normal, no murmur, click, rub or gallop  Abdomen: benign non-tender, without masses or organomegaly palpable and uterus 3FB below  Perineum: healing with good reapproximation  Lochia: mod lochia  Extremities:no redness or tenderness in the calves or thighs, no edema    A/P: PPD # 2 [id number]  Doing well  Continue routine post partum orders  D/c home  pp  Booklet given. D/c instructions reviewed F/u 6 weeks

## 2015-03-07 NOTE — Discharge Summary (Signed)
Obstetric Discharge Summary Reason for Admission: onset of labor Prenatal Procedures: ultrasound Intrapartum Procedures: spontaneous vaginal delivery Postpartum Procedures: none Complications-Operative and Postpartum: 2nd degree perineal laceration HEMOGLOBIN  Date Value Ref Range Status  03/06/2015 11.0* 12.0 - 15.0 g/dL Final   HCT  Date Value Ref Range Status  03/06/2015 33.5* 36.0 - 46.0 % Final    Physical Exam:  General: alert, cooperative and no distress Lochia: appropriate Uterine Fundus: firm Incision: healing well DVT Evaluation: No evidence of DVT seen on physical exam.  Discharge Diagnoses: Term Pregnancy-delivered  Discharge Information: Date: 03/07/2015 Activity: pelvic rest Diet: routine Medications: PNV and Ibuprofen Condition: stable Instructions: refer to practice specific booklet Discharge to: home Follow-up Information    Follow up with MODY,VAISHALI R, MD In 6 weeks.   Specialty:  Obstetrics and Gynecology   Contact information:   [address] [place] [address] [phone]       Newborn Data: Live born female  Birth Weight: 7 lb 11.6 oz (3505 g) APGAR: 9, 9  Home with mother.  Channell Quattrone A 03/07/2015, 11:56 AM

## 2015-03-07 NOTE — Discharge Instructions (Signed)
Call if temperature greater than equal to 100.4, nothing per vagina for 4-6 weeks or severe nausea vomiting, increased incisional pain , showers no bath

## 2015-10-29 IMAGING — US US OB COMP LESS 14 WK
2 series · 14 of 27 positions shown · non-contrast
Comparison: None.

CLINICAL DATA: Positive pregnancy test, size date discrepancy

EXAM:
OBSTETRIC <14 WK ULTRASOUND
TECHNIQUE: Transabdominal ultrasound was performed for evaluation of the
gestation as well as the maternal uterus and adnexal regions.
Patient declined transvaginal imaging which reduces sensitivity and
specificity for detection of pathology.

[Series 1: us ob comp less 14 wk · 0.21mm/px · 26 acquisitions, 13 frames shown (1 of 2)]
[im 1/26]
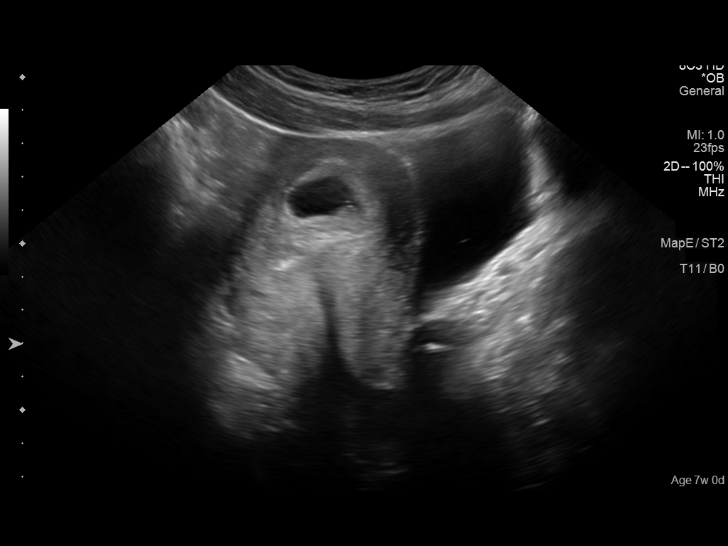
[im 3/26]
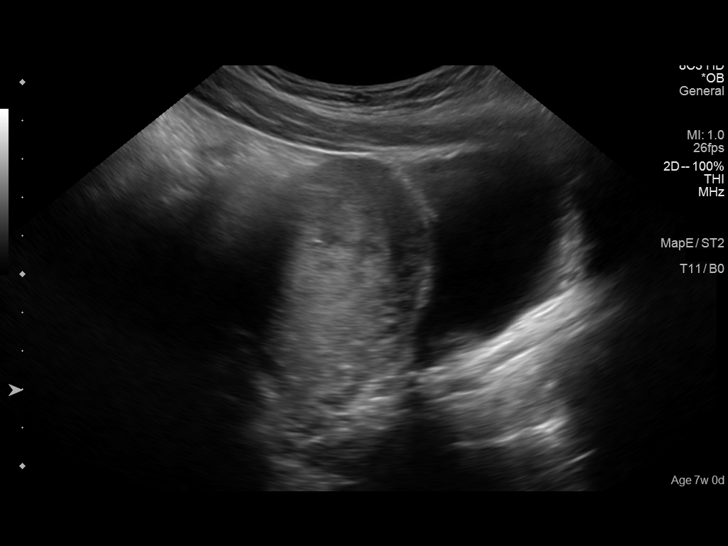
[im 5/26]
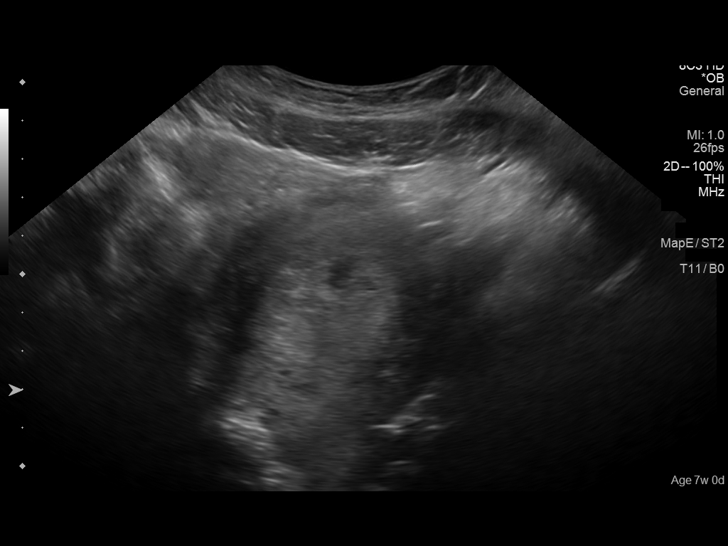
[im 7/26]
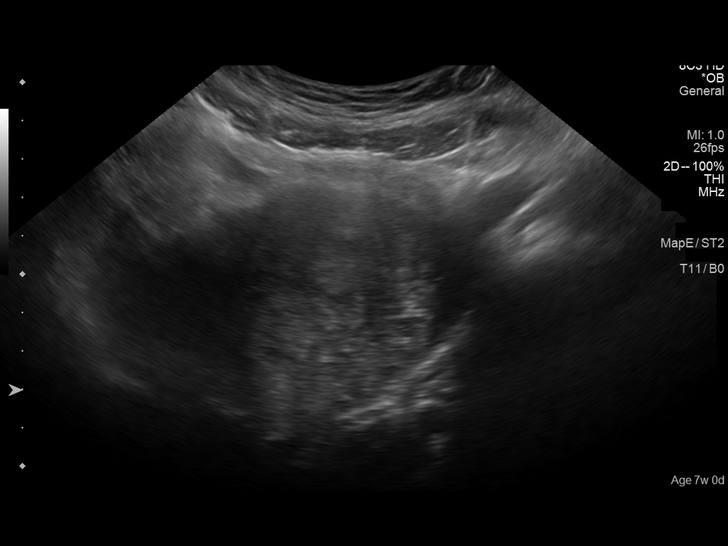
[im 9/26]
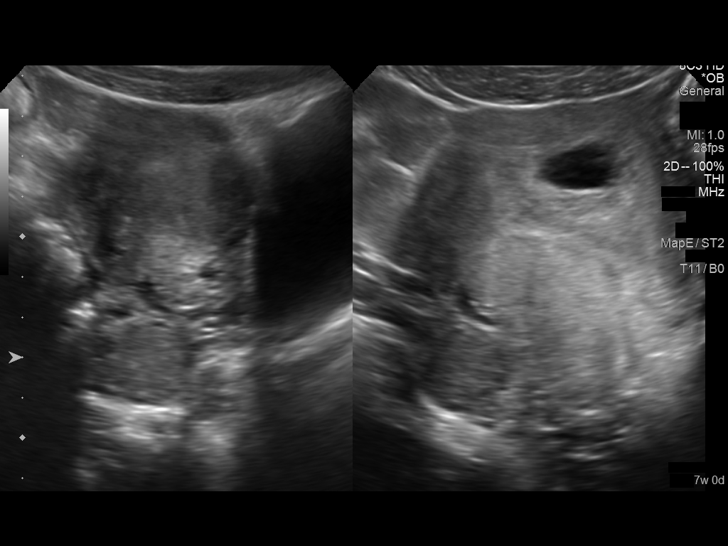
[im 11/26]
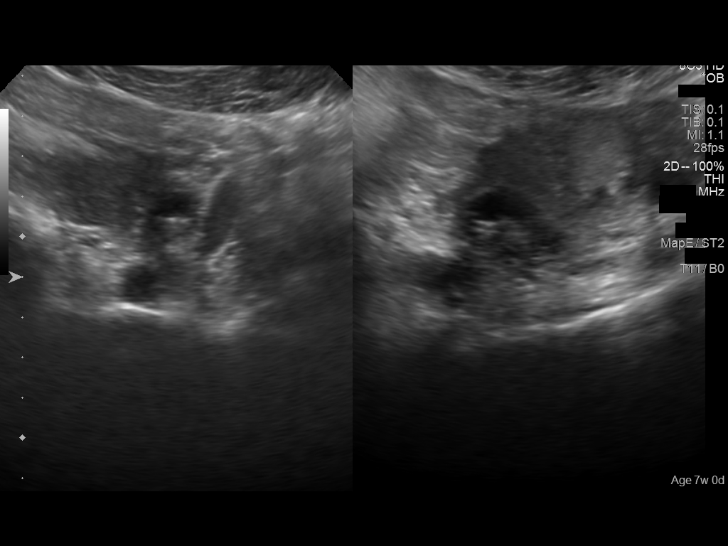
[im 13/26]
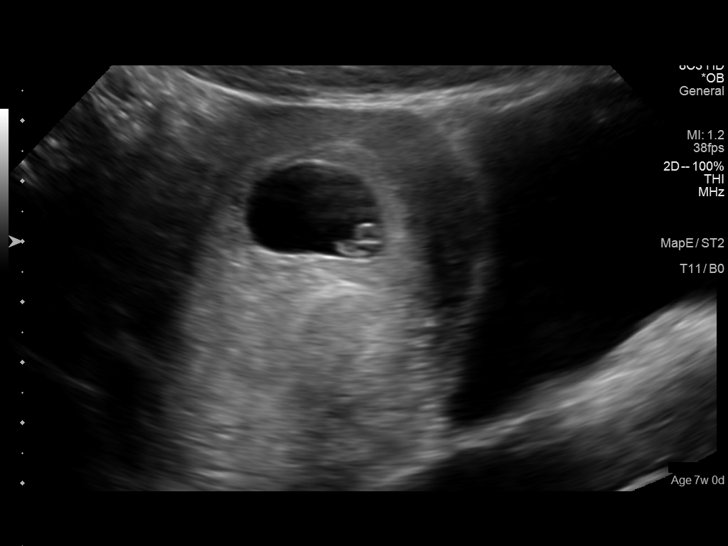
[im 15/26]
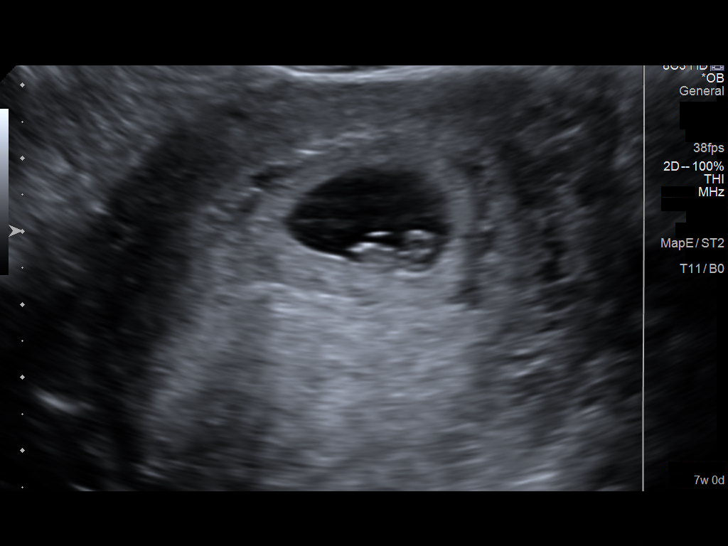
[im 17/26]
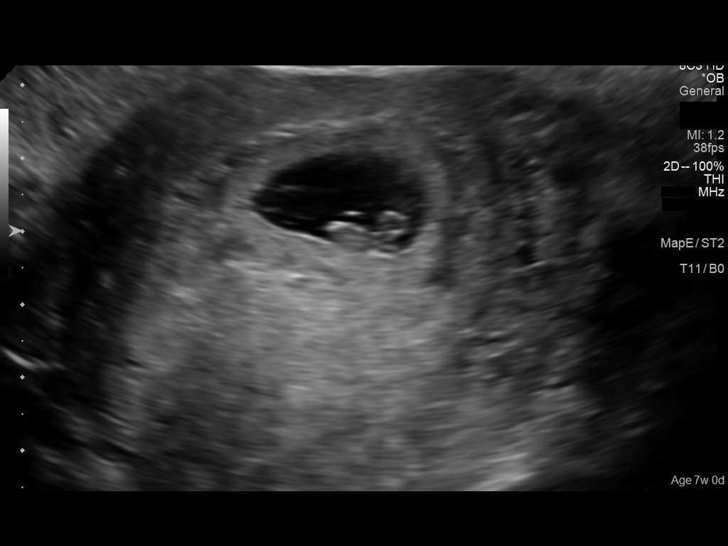
[im 19/26]
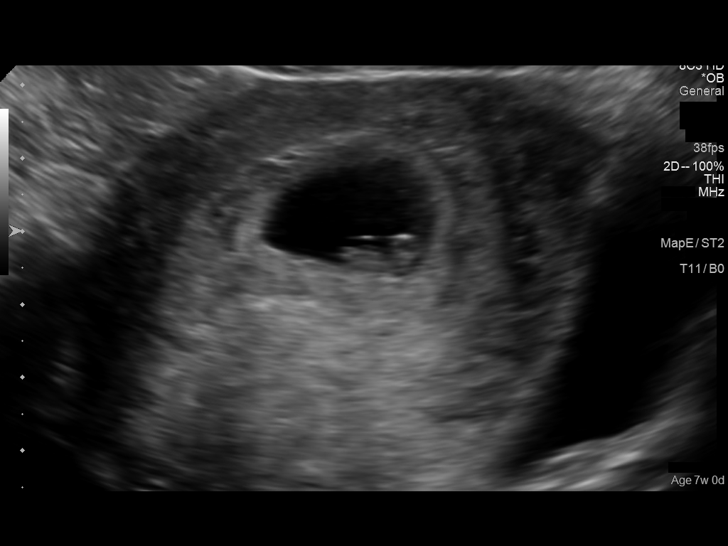
[im 21/26]
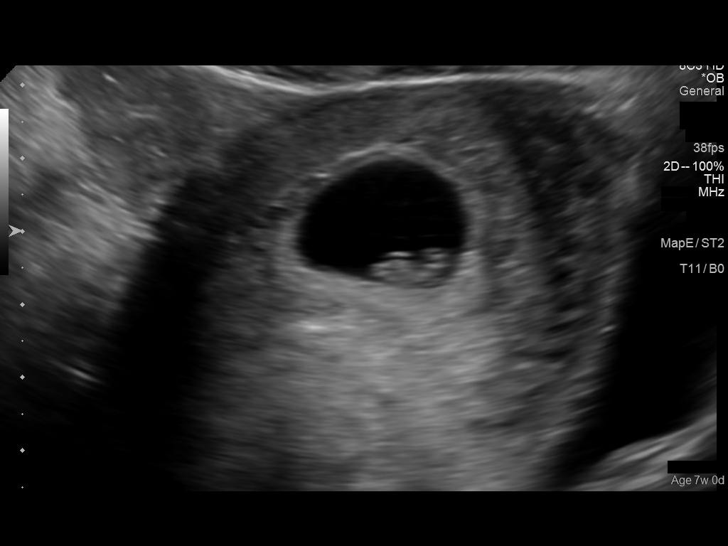
[im 23/26]
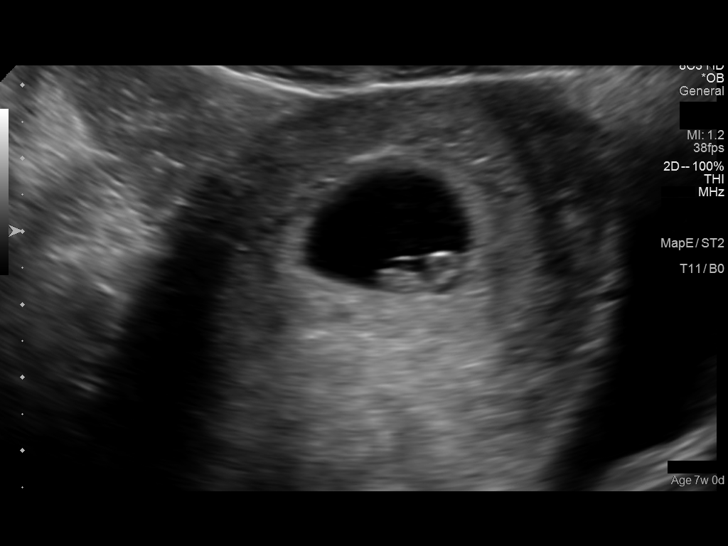
[im 25/26]
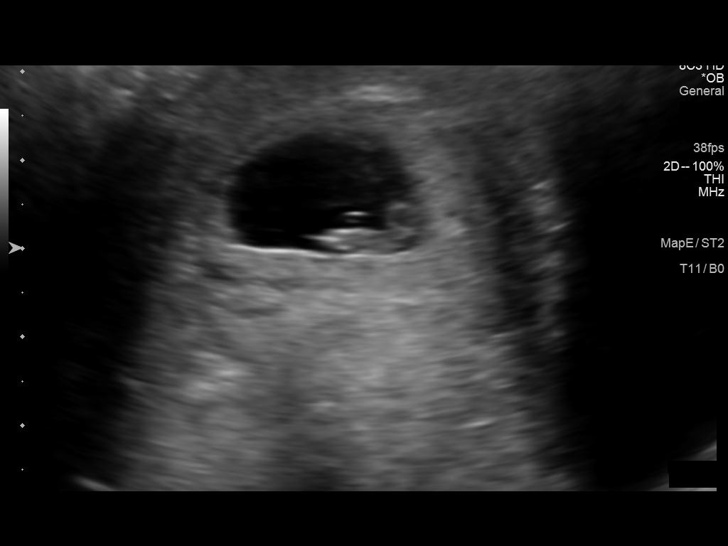

[Series 3: us ob comp less 14 wk · 0.08mm/px · 1 of 1 slices shown (2 of 2)]
[im 1/1]
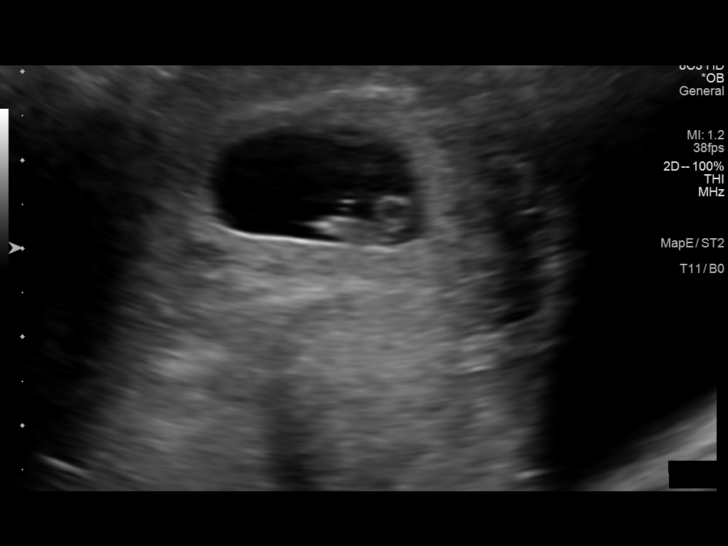

[14 of 27 positions shown; findings below may reference images not displayed]

FINDINGS: Intrauterine gestational sac: Visualized/normal in shape.

Yolk sac:  Visualized

Embryo:  Visualized

Cardiac Activity: Visualized

Heart Rate: 140 bpm

CRL:   8  mm   6 w 5d                  US EDC: 03/15/15

Maternal uterus/adnexae: The ovaries appear normal by transabdominal
technique
IMPRESSION: Single live intrauterine embryo measuring 6 weeks 5 days by today's
exam. No acute abnormality.
